# Patient Record
Sex: Female | Born: 1940 | Race: White | Hispanic: No | Marital: Married | Smoking: Current every day smoker
Health system: Southern US, Community
[De-identification: ages and names within clinical notes are randomized; demographics above are authoritative.]

## PROBLEM LIST (undated history)

## (undated) DIAGNOSIS — I10 Essential (primary) hypertension: Secondary | ICD-10-CM

---

## 2002-04-11 HISTORY — PX: HIP SURGERY: SHX245

## 2006-09-15 ENCOUNTER — Ambulatory Visit: Payer: Self-pay | Admitting: Emergency Medicine

## 2007-09-23 ENCOUNTER — Ambulatory Visit: Payer: Self-pay | Admitting: Internal Medicine

## 2007-09-23 IMAGING — CR DG SHOULDER 3+V*L*
1 series · 3 of 3 positions shown · non-contrast
Comparison: none

REASON FOR EXAM: pain - limited ROM
COMMENTS:

PROCEDURE:     MDR - MDR SHOULDER LEFT COMPLETE  - [DATE]  [DATE]
RESULT:     There views of the LEFT shoulder reveal the bones to be
osteopenic. I do not see evidence of an acute fracture. The AC joint and
glenohumeral joint exhibit no more than mild degenerative change.

[Series 1: view not recorded · 0.17mm/px · 3 of 3 slices shown]
[im 1/3]
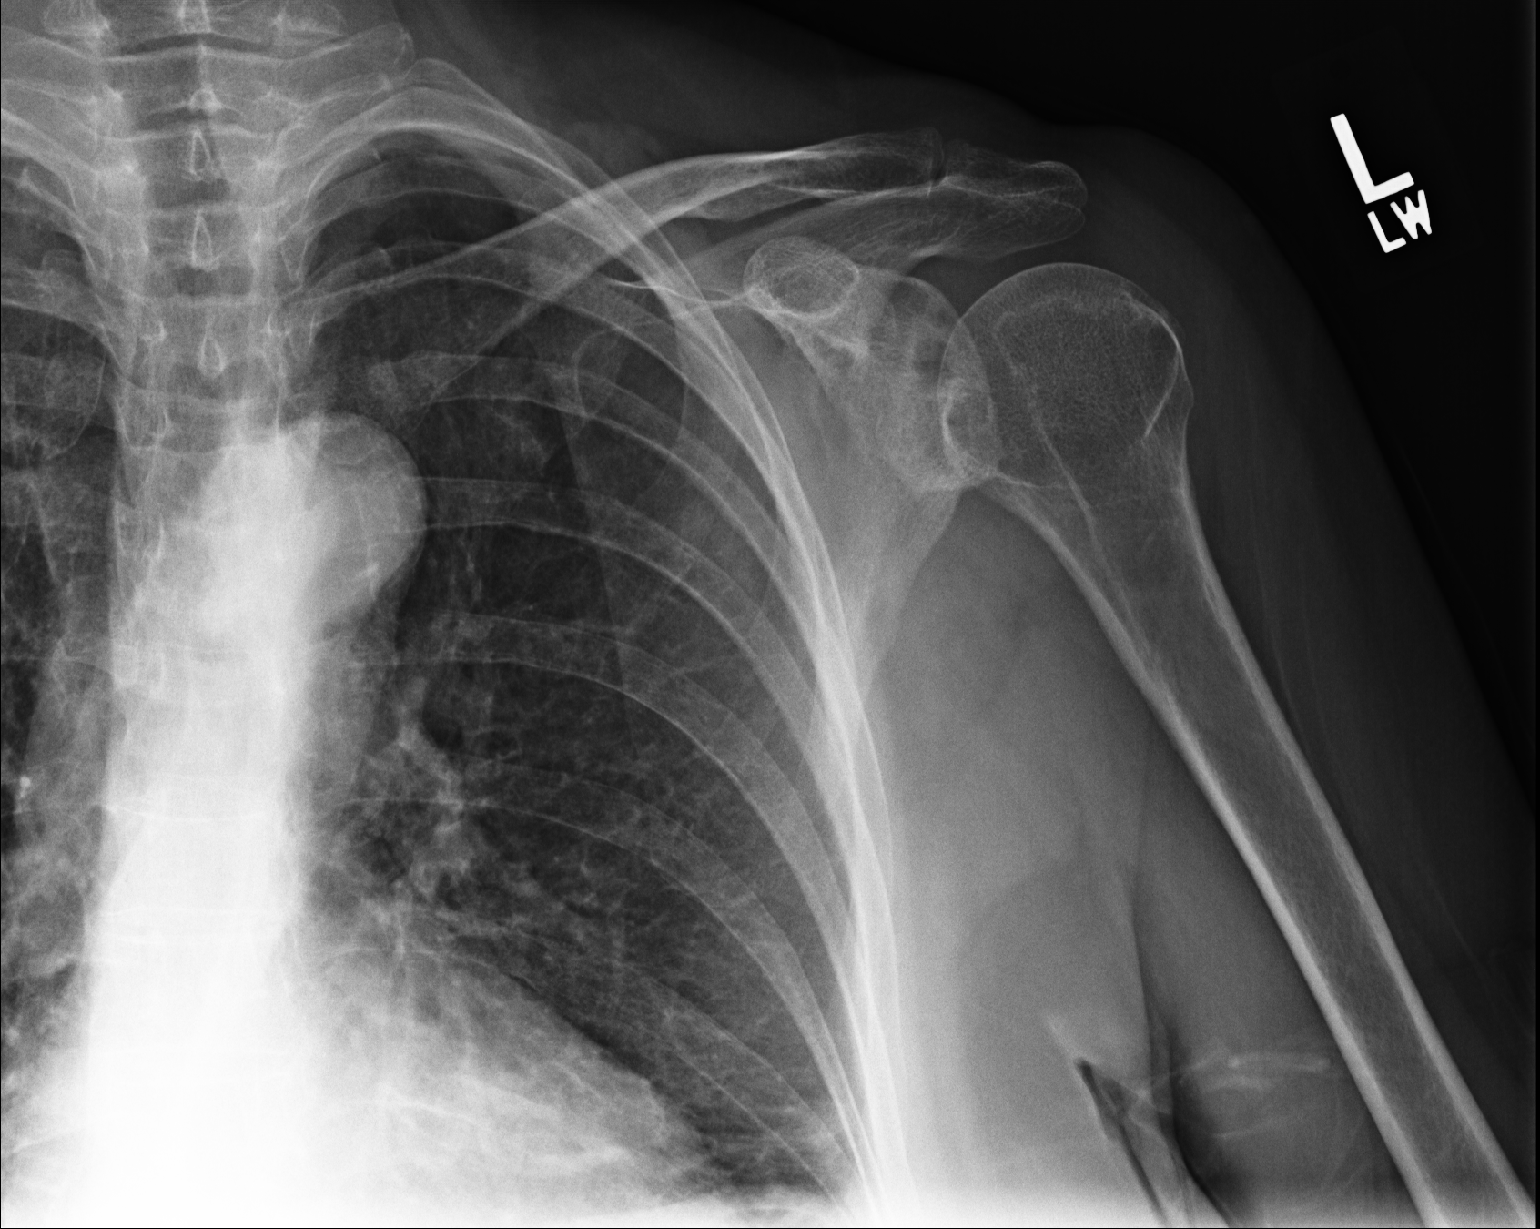
[im 2/3]
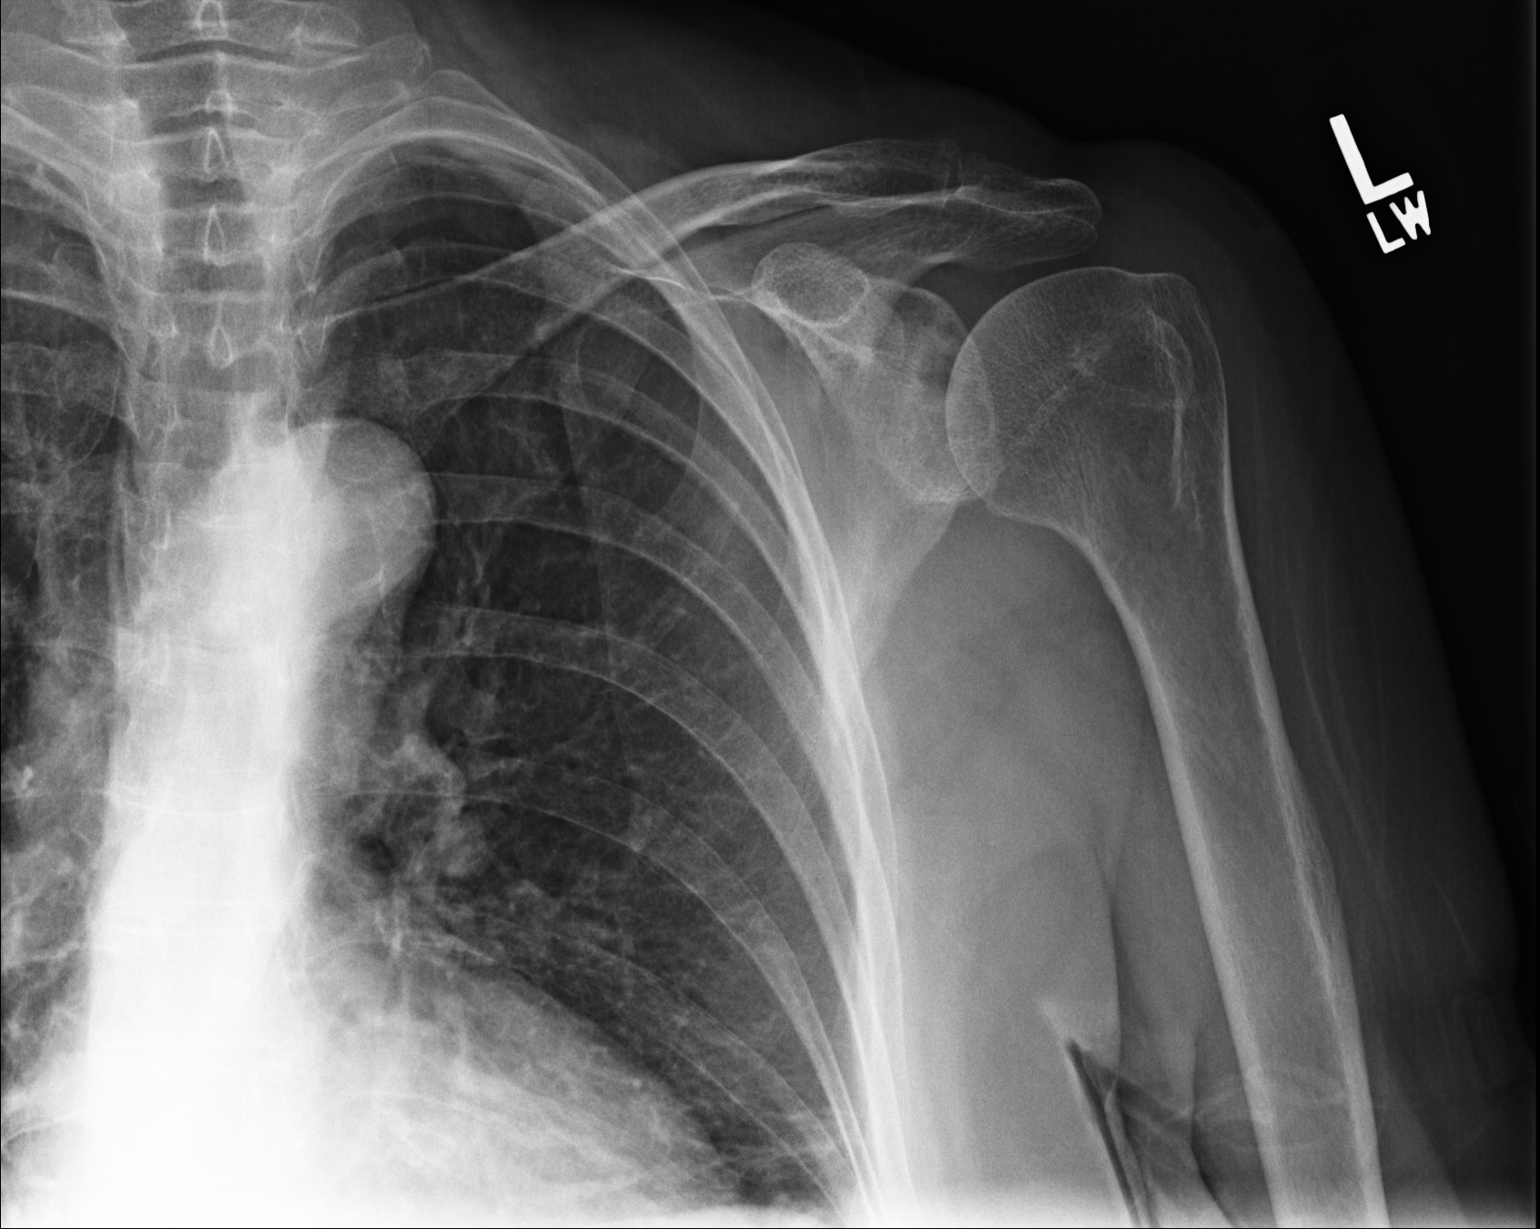
[im 3/3]
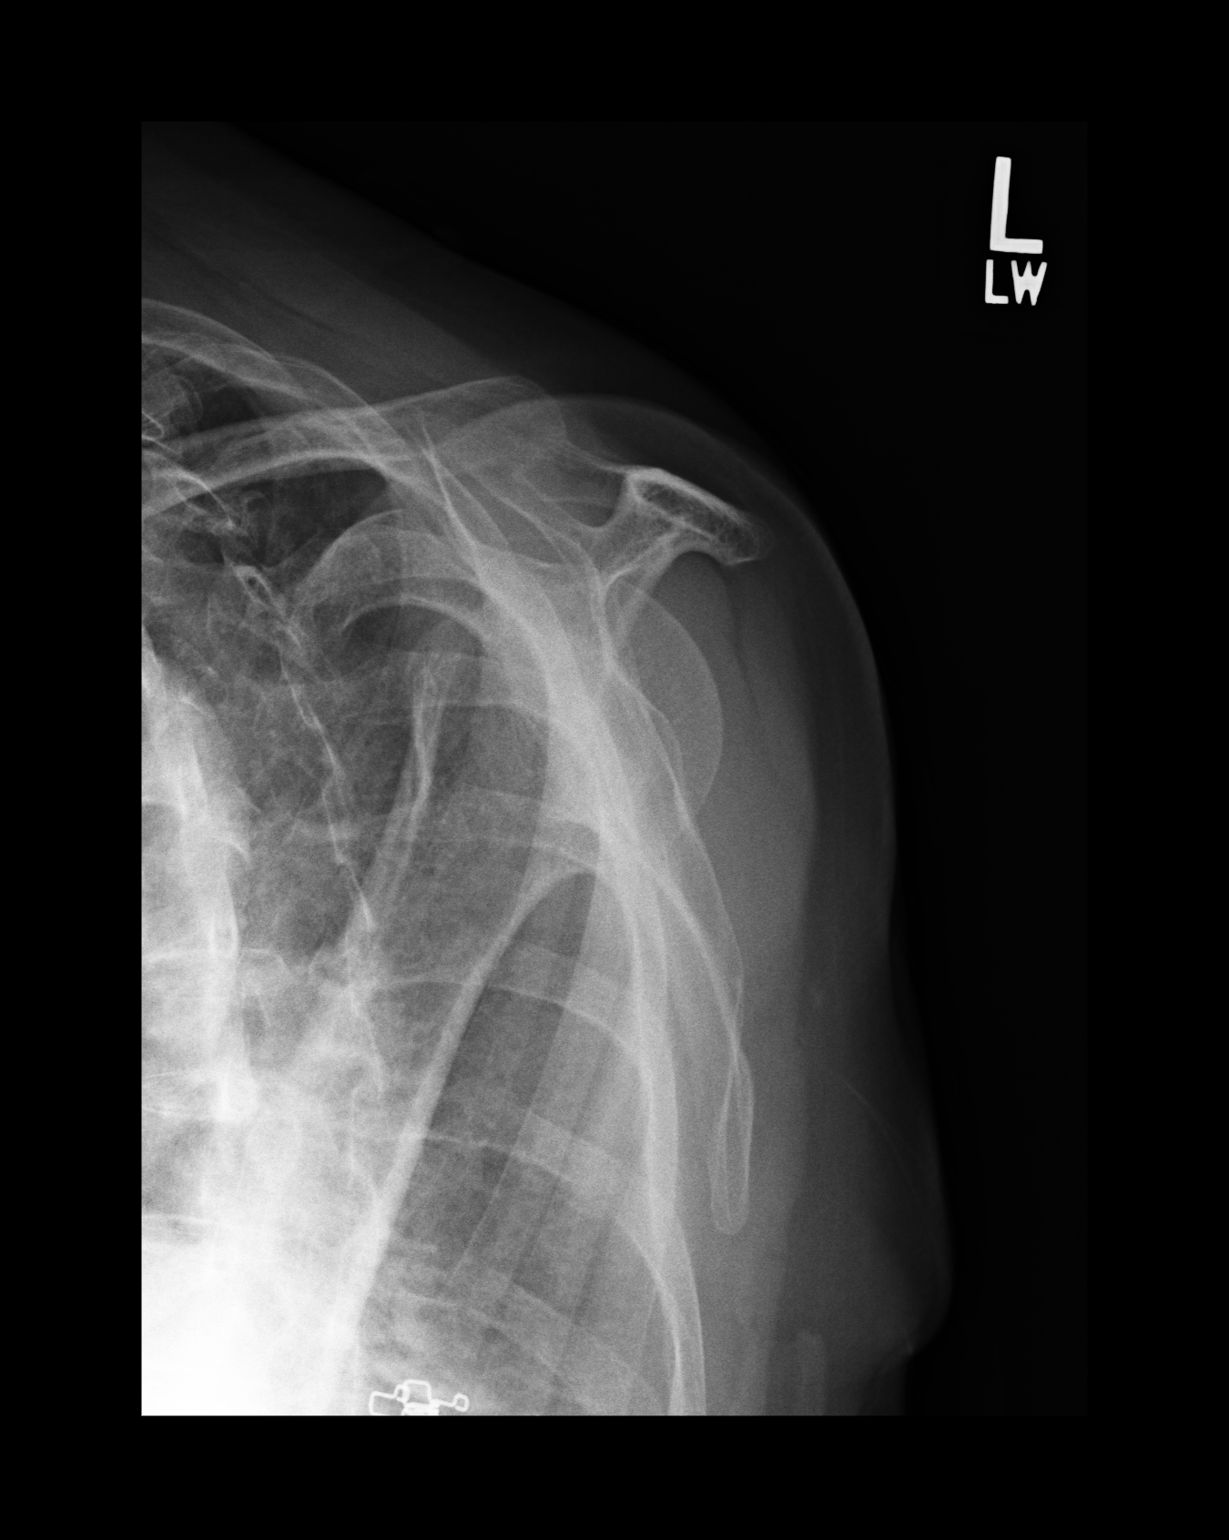

[3 of 3 positions shown; findings below may reference images not displayed]

IMPRESSION: I see no acute bony abnormality of the LEFT shoulder nor
evidence of more than mild degenerative change. Further evaluation with MRI
may be useful especially if rotator cuff pathology is suspected.

## 2008-09-29 ENCOUNTER — Ambulatory Visit: Payer: Self-pay | Admitting: Internal Medicine

## 2017-02-05 ENCOUNTER — Ambulatory Visit
Admission: EM | Admit: 2017-02-05 | Discharge: 2017-02-05 | Disposition: A | Discharge status: Home / Self Care | Payer: Medicare HMO | Attending: Family Medicine | Admitting: Family Medicine

## 2017-02-05 DIAGNOSIS — J0100 Acute maxillary sinusitis, unspecified: Secondary | ICD-10-CM | POA: Diagnosis not present

## 2017-02-05 HISTORY — DX: Essential (primary) hypertension: I10

## 2017-02-05 MED ORDER — DOXYCYCLINE HYCLATE 100 MG PO TABS: 100.0000 mg | ORAL_TABLET | Freq: Two times a day (BID) | ORAL | 0 refills | Status: AC

## 2017-02-05 NOTE — ED Provider Notes (Signed)
[facility]    CSN: [id number] Arrival date & time: 02/05/17  1417     History   Chief Complaint Chief Complaint  Patient presents with  . Influenza    HPI Teresa Acosta is a 76 y.o. female.   The history is provided by the patient.  URI  Presenting symptoms: congestion, cough, facial pain and fatigue   Severity:  Moderate Onset quality:  Sudden Duration:  1 week Timing:  Constant Progression:  Unchanged Chronicity:  New Relieved by:  Prescription medications (states felt better after zpak about 2 weeks ago) Risk factors: being elderly     Past Medical History:  Diagnosis Date  . Hypertension     There are no active problems to display for this patient.   Past Surgical History:  Procedure Laterality Date  . HIP SURGERY Right 2004    OB History    No data available       Home Medications    Prior to Admission medications   Medication Sig Start Date End Date Taking? Authorizing Provider  amLODipine (NORVASC) 2.5 MG tablet Take 2.5 mg by mouth daily.   Yes [provider]  aspirin EC 81 MG tablet Take 81 mg by mouth daily.   Yes [provider]  calcium-vitamin D 250-100 MG-UNIT tablet Take 1 tablet by mouth 2 (two) times daily.   Yes [provider]  co-enzyme Q-10 50 MG capsule Take 50 mg by mouth daily.   Yes [provider]  esomeprazole (NEXIUM) 40 MG capsule Take 40 mg by mouth daily at 12 noon.   Yes [provider]  levothyroxine (SYNTHROID, LEVOTHROID) 75 MCG tablet Take 75 mcg by mouth daily before breakfast.   Yes [provider]  metoprolol tartrate (LOPRESSOR) 50 MG tablet Take 50 mg by mouth 2 (two) times daily.   Yes [provider]  sertraline (ZOLOFT) 50 MG tablet Take 50 mg by mouth daily.   Yes [provider]  simvastatin (ZOCOR) 40 MG tablet Take 40 mg by mouth daily.   Yes [provider]  triamterene-hydrochlorothiazide (DYAZIDE) 37.5-25 MG  capsule Take 1 capsule by mouth daily.   Yes [provider]  vitamin B-12 (CYANOCOBALAMIN) 1000 MCG tablet Take 1,000 mcg by mouth daily.   Yes [provider]  doxycycline (VIBRA-TABS) 100 MG tablet Take 1 tablet (100 mg total) by mouth 2 (two) times daily. 02/05/17   Payton Mccallum, MD    Family History History reviewed. No pertinent family history.  Social History Social History  Substance Use Topics  . Smoking status: Current Every Day Smoker    Packs/day: 0.50  . Smokeless tobacco: Never Used  . Alcohol use No     Allergies   Codeine; Penicillins; and Sulfa antibiotics   Review of Systems Review of Systems  Constitutional: Positive for fatigue.  HENT: Positive for congestion.   Respiratory: Positive for cough.      Physical Exam Triage Vital Signs ED Triage Vitals  Enc Vitals Group     BP 02/05/17 1453 (!) 172/93     Pulse Rate 02/05/17 1453 75     Resp 02/05/17 1453 18     Temp 02/05/17 1453 98.2 F (36.8 C)     Temp Source 02/05/17 1453 Oral     SpO2 02/05/17 1453 100 %     Weight 02/05/17 1453 155 lb (70.3 kg)     Height 02/05/17 1453 5\' 1"  (1.549 m)     Head  Circumference --      Peak Flow --      Pain Score 02/05/17 1451 7     Pain Loc --      Pain Edu? --      Excl. in GC? --    No data found.   Updated Vital Signs BP (!) 172/93 (BP Location: Left Arm)   Pulse 75   Temp 98.2 F (36.8 C) (Oral)   Resp 18   Ht 5\' 1"  (1.549 m)   Wt 155 lb (70.3 kg)   SpO2 100%   BMI 29.29 kg/m   Visual Acuity Right Eye Distance:   Left Eye Distance:   Bilateral Distance:    Right Eye Near:   Left Eye Near:    Bilateral Near:     Physical Exam  Constitutional: She appears well-developed and well-nourished. No distress.  HENT:  Head: Normocephalic and atraumatic.  Right Ear: Tympanic membrane, external ear and ear canal normal.  Left Ear: Tympanic membrane, external ear and ear canal normal.  Nose: Mucosal edema and rhinorrhea  present. No nose lacerations, sinus tenderness, nasal deformity, septal deviation or nasal septal hematoma. No epistaxis.  No foreign bodies. Right sinus exhibits maxillary sinus tenderness and frontal sinus tenderness. Left sinus exhibits maxillary sinus tenderness and frontal sinus tenderness.  Mouth/Throat: Uvula is midline, oropharynx is clear and moist and mucous membranes are normal. No oropharyngeal exudate.  Eyes: Pupils are equal, round, and reactive to light. Conjunctivae and EOM are normal. Right eye exhibits no discharge. Left eye exhibits no discharge. No scleral icterus.  Neck: Normal range of motion. Neck supple. No thyromegaly present.  Cardiovascular: Normal rate, regular rhythm and normal heart sounds.   Pulmonary/Chest: Effort normal and breath sounds normal. No respiratory distress. She has no wheezes. She has no rales.  Lymphadenopathy:    She has no cervical adenopathy.  Skin: She is not diaphoretic.  Nursing note and vitals reviewed.    UC Treatments / Results  Labs (all labs ordered are listed, but only abnormal results are displayed) Labs Reviewed - No data to display  EKG  EKG Interpretation None       Radiology No results found.  Procedures Procedures (including critical care time)  Medications Ordered in UC Medications - No data to display   Initial Impression / Assessment and Plan / UC Course  I have reviewed the triage vital signs and the nursing notes.  Pertinent labs & imaging results that were available during my care of the patient were reviewed by me and considered in my medical decision making (see chart for details).       Final Clinical Impressions(s) / UC Diagnoses   Final diagnoses:  Acute maxillary sinusitis, recurrence not specified    New Prescriptions Discharge Medication List as of 02/05/2017  3:33 PM    START taking these medications   Details  doxycycline (VIBRA-TABS) 100 MG tablet Take 1 tablet (100 mg total) by  mouth 2 (two) times daily., Starting Sun 02/05/2017, Normal       1.diagnosis reviewed with patient 2. rx as per orders above; reviewed possible side effects, interactions, risks and benefits  3. Follow-up prn if symptoms worsen or don't improve  Controlled Substance Prescriptions [place] Controlled Substance Registry consulted? Not Applicable   Payton Mccallumonty, Deshaun Weisinger, MD 02/05/17 [id number]

## 2017-02-05 NOTE — ED Triage Notes (Signed)
Patient states that she was diagnosed with influenza on 01/24/2017. Patient states that she is still coughing and still feeling weak. Patient states that she was also given a zpak at her visit on 10/16.

## 2020-08-12 ENCOUNTER — Other Ambulatory Visit: Payer: Self-pay | Admitting: Physical Medicine & Rehabilitation

## 2020-08-12 ENCOUNTER — Other Ambulatory Visit: Payer: Self-pay

## 2020-08-12 ENCOUNTER — Ambulatory Visit
Admission: RE | Admit: 2020-08-12 | Discharge: 2020-08-12 | Disposition: A | Discharge status: Home / Self Care | Payer: Medicare HMO | Source: Ambulatory Visit | Attending: Physical Medicine & Rehabilitation | Admitting: Physical Medicine & Rehabilitation

## 2020-08-12 DIAGNOSIS — M5442 Lumbago with sciatica, left side: Secondary | ICD-10-CM | POA: Insufficient documentation

## 2020-08-12 DIAGNOSIS — G8929 Other chronic pain: Secondary | ICD-10-CM | POA: Diagnosis present

## 2020-08-12 IMAGING — MR MR LUMBAR SPINE W/O CM
4 of 6 series · 31 of 48 positions shown · non-contrast
Comparison: None.

CLINICAL DATA: Chronic low back pain.

EXAM:
MRI LUMBAR SPINE WITHOUT CONTRAST
TECHNIQUE: Multiplanar, multisequence MR imaging of the lumbar spine was
performed. No intravenous contrast was administered.

[Series 2: T2 · sagittal · 4.0mm · 0.81mm/px · 6 of 17 slices shown (1 of 3)]
[im 1/17]
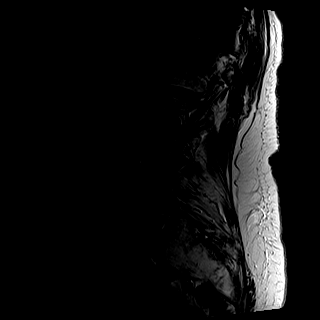
[im 4/17]
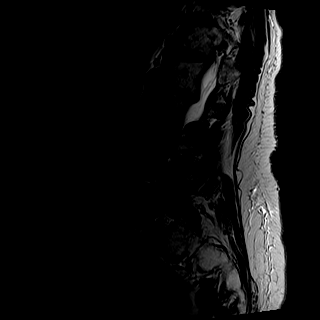
[im 7/17]
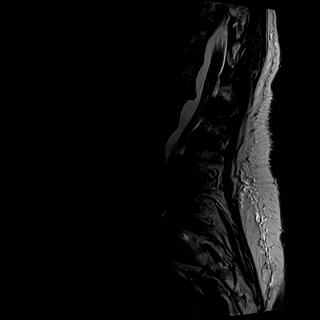
[im 10/17]
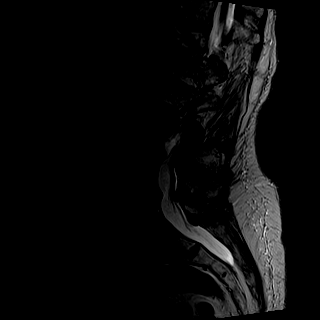
[im 13/17]
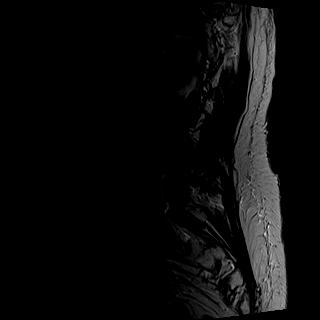
[im 17/17]
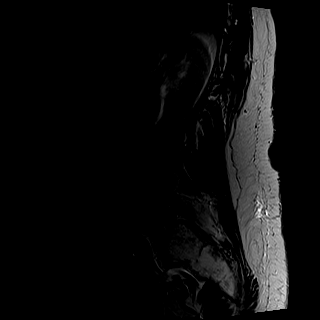

[Series 3: T1 · sagittal · 4.0mm · 0.41mm/px · 6 of 17 slices shown]
[im 1/17]
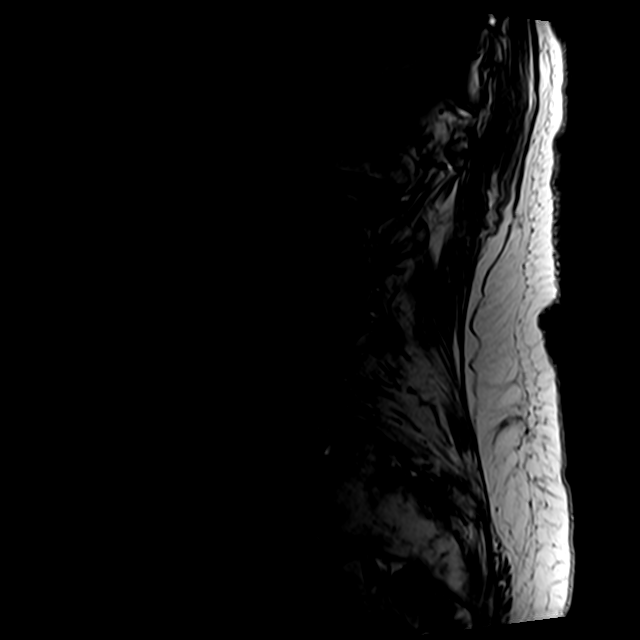
[im 4/17]
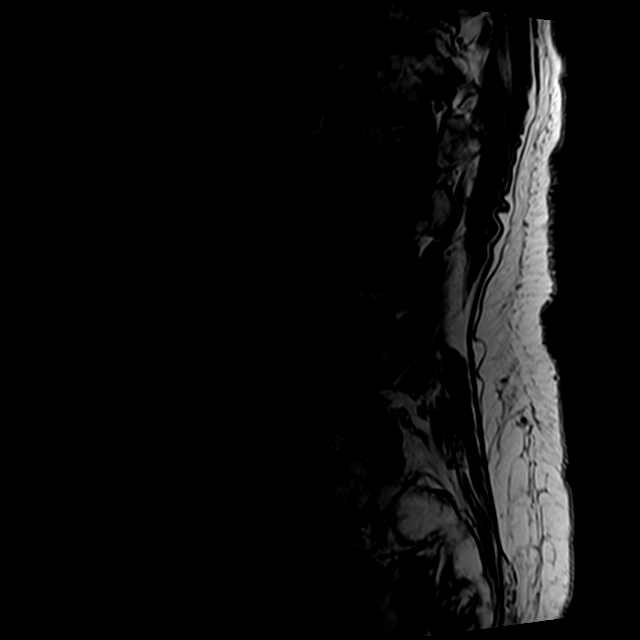
[im 7/17]
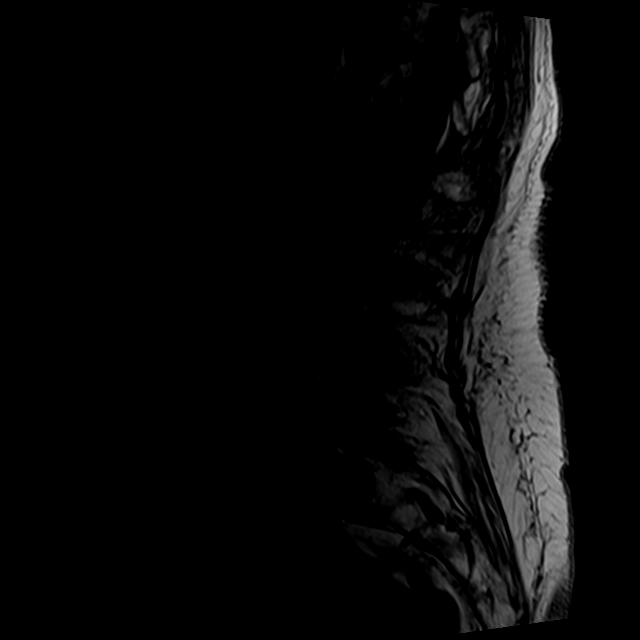
[im 10/17]
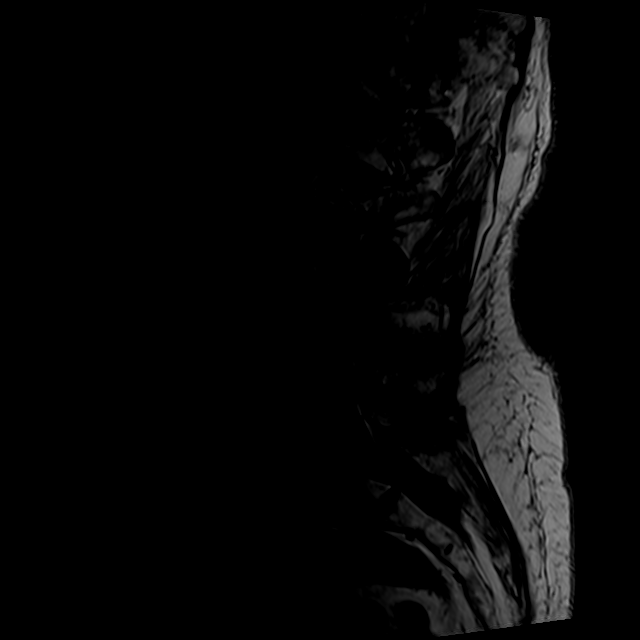
[im 13/17]
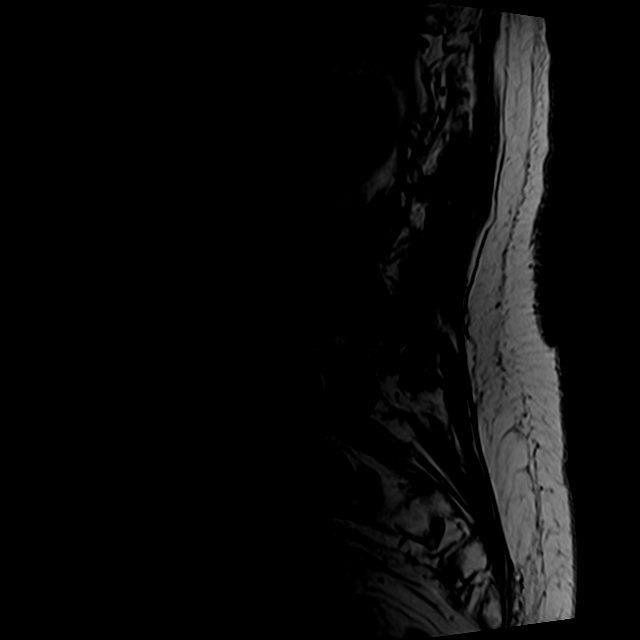
[im 17/17]
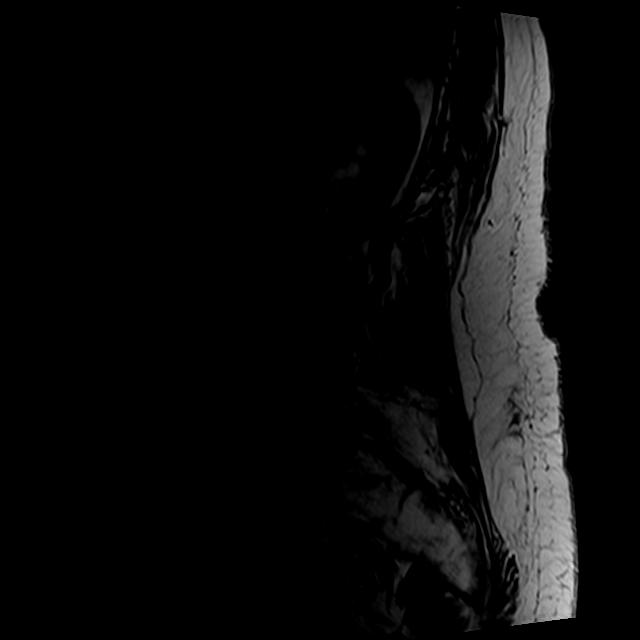

[Series 5: T2 · axial · 4.0mm · 0.78mm/px · z∈[-106,+97]mm · 11 of 34 slices shown (2 of 3)]
[im 1/34]
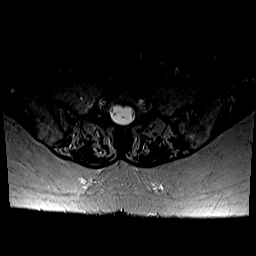
[im 4/34]
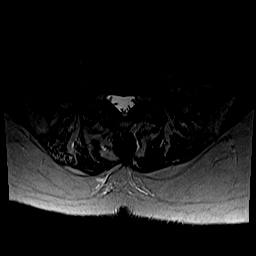
[im 7/34]
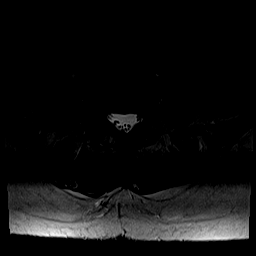
[im 10/34]
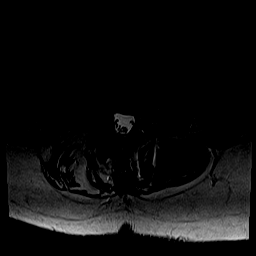
[im 14/34]
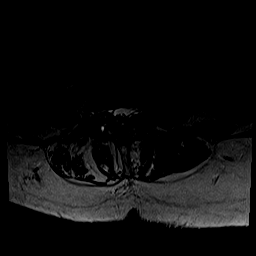
[im 17/34]
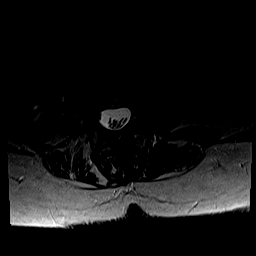
[im 20/34]
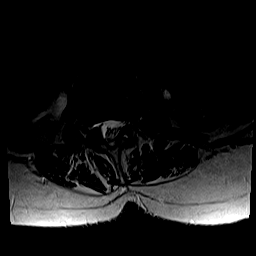
[im 24/34]
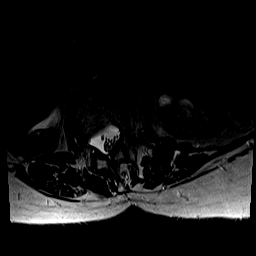
[im 27/34]
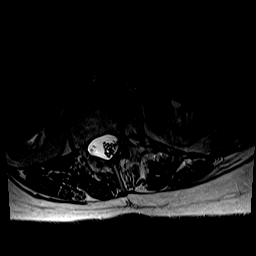
[im 30/34]
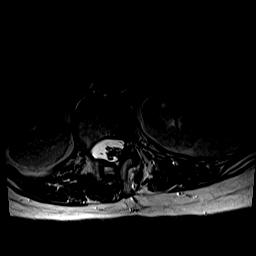
[im 34/34]
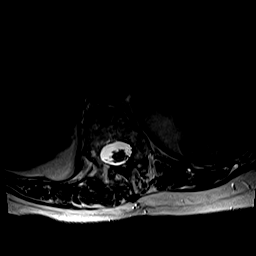

[Series 7: T2 · coronal · 3.0mm · 1.00mm/px · 8 of 24 slices shown (3 of 3)]
[im 1/24]
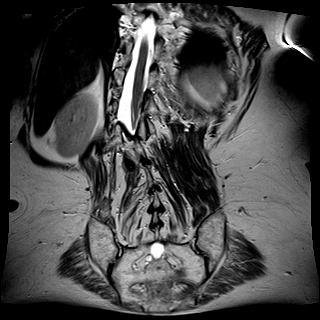
[im 4/24]
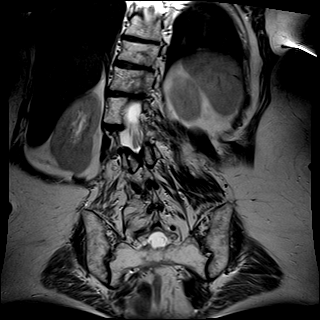
[im 7/24]
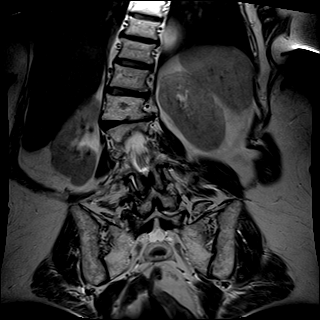
[im 10/24]
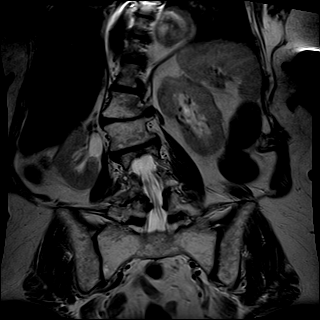
[im 14/24]
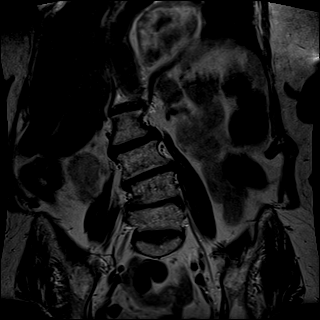
[im 17/24]
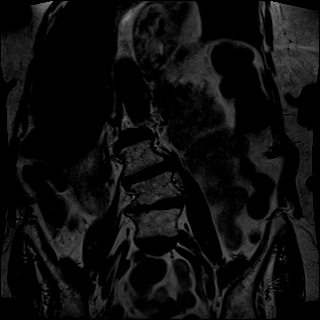
[im 20/24]
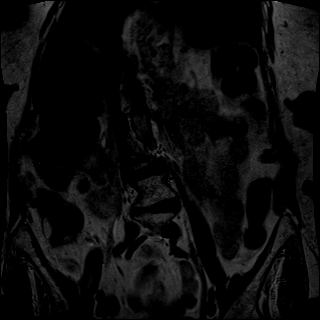
[im 24/24]
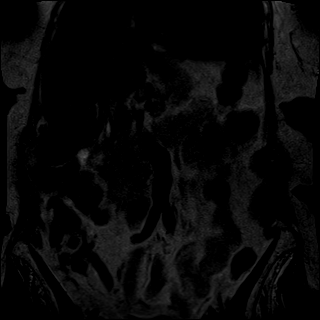

[31 of 48 positions shown; findings below may reference images not displayed]

FINDINGS: Segmentation: Standard segmentation soon. The inferior-most fully
formed intervertebral disc is labeled L5-S1.

Alignment: Marked dextrocurvature of the upper lumbar spine with
compensatory levocurvature of the lower lumbar spine. Slight (grade
1) retrolisthesis of L1 on L2, L2 on L3, and L3 on L4.

Vertebrae: Discogenic/degenerative endplate signal changes at
multiple levels. No specific evidence of acute fracture or
discitis/osteomyelitis.

Conus medullaris and cauda equina: Conus extends to the L1 level.
Conus appears normal

Paraspinal and other soft tissues: Large hiatal hernia, partially
imaged.

Disc levels:

T12-L1: Mild disc bulging and facet hypertrophy with mild left
foraminal stenosis. No significant canal stenosis.

L1-L2: Slight (grade 1) retrolisthesis of L1 on L2. mild broad disc
bulging and mild bilateral facet hypertrophy with mild-to-moderate
left foraminal stenosis. Mild left subarticular recess stenosis
without significant canal stenosis.

L2-L3: Slight (grade 1) retrolisthesis of L2 on L3. Disc bulging and
bilateral facet hypertrophy with mild to moderate left foraminal
stenosis. Mild bilateral subarticular recess stenosis without
significant canal stenosis.

L3-L4: Broad disc bulge with moderate right greater than left facet
hypertrophy and ligamentum flavum thickening. Resulting mild canal
and bilateral subarticular recess stenosis. Moderate right and
mild-to-moderate left foraminal stenosis.

L4-L5: Broad disc bulge with severe right and mild left facet
hypertrophy and ligamentum flavum thickening. Resulting severe right
foraminal stenosis. No significant left foraminal stenosis. Moderate
right subarticular recess stenosis with possible impingement of
descending right L5 nerve roots. Mild canal stenosis. Mild disc
bulging and bilateral facet hypertrophy without significant canal or
foraminal stenosis.

L5-S1: No significant disc protrusion, foraminal stenosis, or canal
stenosis.
IMPRESSION: 1. Severe right foraminal stenosis at L4-L5. Mild to moderate
foraminal stenosis on the left at L1-L2, L2-L3, and L3-L4.
2. Mild canal stenosis at L3-L4 and L4-L5. Moderate right
subarticular recess stenosis at L4-L5 with possible impingement of
descending right L5 nerve roots.
3. Marked dextrocurvature of the upper lumbar spine with
compensatory levocurvature of the lower lumbar spine.
4. Large hiatal hernia, partially imaged.

## 2021-03-06 ENCOUNTER — Other Ambulatory Visit: Payer: Self-pay

## 2021-03-06 ENCOUNTER — Encounter: Payer: Self-pay | Admitting: Emergency Medicine

## 2021-03-06 ENCOUNTER — Ambulatory Visit
Admission: EM | Admit: 2021-03-06 | Discharge: 2021-03-06 | Disposition: A | Discharge status: Home / Self Care | Payer: Medicare HMO | Attending: Physician Assistant | Admitting: Physician Assistant

## 2021-03-06 DIAGNOSIS — M79605 Pain in left leg: Secondary | ICD-10-CM

## 2021-03-06 DIAGNOSIS — G8929 Other chronic pain: Secondary | ICD-10-CM | POA: Diagnosis not present

## 2021-03-06 DIAGNOSIS — I8392 Asymptomatic varicose veins of left lower extremity: Secondary | ICD-10-CM | POA: Diagnosis not present

## 2021-03-06 DIAGNOSIS — M545 Low back pain, unspecified: Secondary | ICD-10-CM

## 2021-03-06 MED ORDER — TRAMADOL HCL 50 MG PO TABS: 50.0000 mg | ORAL_TABLET | Freq: Two times a day (BID) | ORAL | 0 refills | Status: AC | PRN

## 2021-03-06 NOTE — ED Triage Notes (Addendum)
Patient c/o left lower leg pain that started Thursday morning.  Patient states pain is worse when she walks. Patient denies injury or fall.

## 2021-03-06 NOTE — ED Provider Notes (Signed)
[facility]    CSN: [id number] Arrival date & time: 03/06/21  1211      History   Chief Complaint Chief Complaint  Patient presents with   Leg Pain    Left lower    HPI Teresa Acosta is a 80 y.o. female presenting for left lower leg pain for the past 2 to 3 days.  Patient says that it woke her up and its been aching ever since.  She is it feels like it is deep to the bone.  Patient denies any sort of injury.  Increased pain when she is sitting.  Pain seems to improve when she stands.  No associated numbness, weakness or tingling.  Patient denies any fever, increased warmth of the skin or increased swelling.  She does have significant varicose veins mostly of the left lower extremity.  She denies any history of DVT or PE.  Takes daily aspirin.  Patient denies any recent surgeries but says that she did receive an epidural injection for chronic back pain 2 weeks ago.  She has follow-up in 2 days with the provider who performed the injection.  Patient says her back pain has never affected the left extremity but has gone down the right leg at times.  She says her back pain has improved from when she received the injection.  Patient does not report any chest pain or breathing difficulty.  She has tried Tylenol for pain.  She has no other complaints.  HPI  Past Medical History:  Diagnosis Date   Hypertension     There are no problems to display for this patient.   Past Surgical History:  Procedure Laterality Date   HIP SURGERY Right 2004    OB History   No obstetric history on file.      Home Medications    Prior to Admission medications   Medication Sig Start Date End Date Taking? Authorizing Provider  traMADol (ULTRAM) 50 MG tablet Take 1 tablet (50 mg total) by mouth every 12 (twelve) hours as needed for up to 3 days for severe pain. 03/06/21 03/09/21 Yes Eusebio Friendly B, PA-C  amLODipine (NORVASC) 2.5 MG tablet Take 2.5 mg by mouth daily.    [provider]  aspirin EC 81 MG tablet Take 81 mg by mouth daily.    [provider]  calcium-vitamin D 250-100 MG-UNIT tablet Take 1 tablet by mouth 2 (two) times daily.    [provider]  co-enzyme Q-10 50 MG capsule Take 50 mg by mouth daily.    [provider]  doxycycline (VIBRA-TABS) 100 MG tablet Take 1 tablet (100 mg total) by mouth 2 (two) times daily. 02/05/17   Payton Mccallum, MD  esomeprazole (NEXIUM) 40 MG capsule Take 40 mg by mouth daily at 12 noon.    [provider]  levothyroxine (SYNTHROID, LEVOTHROID) 75 MCG tablet Take 75 mcg by mouth daily before breakfast.    [provider]  metoprolol tartrate (LOPRESSOR) 50 MG tablet Take 50 mg by mouth 2 (two) times daily.    [provider]  sertraline (ZOLOFT) 50 MG tablet Take 50 mg by mouth daily.    [provider]  simvastatin (ZOCOR) 40 MG tablet Take 40 mg by mouth daily.    [provider]  triamterene-hydrochlorothiazide (DYAZIDE) 37.5-25 MG capsule Take 1 capsule by mouth daily.    [provider]  vitamin B-12 (CYANOCOBALAMIN) 1000 MCG tablet Take 1,000 mcg by mouth daily.  [provider]    Family History History reviewed. No pertinent family history.  Social History Social History   Tobacco Use   Smoking status: Every Day    Packs/day: 0.50    Types: Cigarettes   Smokeless tobacco: Never  Vaping Use   Vaping Use: Never used  Substance Use Topics   Alcohol use: No   Drug use: No     Allergies   Codeine, Penicillins, Sulfa antibiotics, and Doxycycline   Review of Systems Review of Systems  Constitutional:  Negative for fatigue and fever.  Respiratory:  Negative for shortness of breath.   Cardiovascular:  Negative for chest pain and leg swelling.  Musculoskeletal:  Positive for back pain (chronic). Negative for gait problem and joint swelling.       +Left lower leg pain, mostly of the calf but does go to heel   Skin:  Negative for color change, rash and wound.  Neurological:  Negative for weakness and numbness.    Physical Exam Triage Vital Signs ED Triage Vitals  Enc Vitals Group     BP 03/06/21 1255 136/72     Pulse Rate 03/06/21 1255 72     Resp 03/06/21 1255 14     Temp 03/06/21 1255 98.6 F (37 C)     Temp Source 03/06/21 1255 Oral     SpO2 03/06/21 1255 99 %     Weight 03/06/21 1253 150 lb (68 kg)     Height 03/06/21 1253 5\' 1"  (1.549 m)     Head Circumference --      Peak Flow --      Pain Score 03/06/21 1253 7     Pain Loc --      Pain Edu? --      Excl. in GC? --    No data found.  Updated Vital Signs BP 136/72 (BP Location: Left Arm)   Pulse 72   Temp 98.6 F (37 C) (Oral)   Resp 14   Ht 5\' 1"  (1.549 m)   Wt 150 lb (68 kg)   SpO2 99%   BMI 28.34 kg/m     Physical Exam Vitals and nursing note reviewed.  Constitutional:      General: She is not in acute distress.    Appearance: Normal appearance. She is not ill-appearing or toxic-appearing.  HENT:     Head: Normocephalic and atraumatic.  Eyes:     General: No scleral icterus.       Right eye: No discharge.        Left eye: No discharge.     Conjunctiva/sclera: Conjunctivae normal.  Cardiovascular:     Rate and Rhythm: Normal rate and regular rhythm.     Heart sounds: Normal heart sounds.  Pulmonary:     Effort: Pulmonary effort is normal. No respiratory distress.     Breath sounds: Normal breath sounds.  Musculoskeletal:     Cervical back: Neck supple.     Comments: LEFT LOWER LEG: Significant torturous varicose veins mostly of the anterior left lower leg.  No erythema or warmth.  No rashes, abrasions or lacerations.  36 cm circumference of the left calf which is equal to that of the right calf and measured.  Tenderness to palpation of the entire left lower leg, mostly of the calf.  Full range of motion of the knee and ankle.  Good strength and sensation.  Nontender to palpation of the lower back or  spine.  Negative straight leg raise bilaterally.  Skin:    General: Skin is dry.  Neurological:     General: No focal deficit present.     Mental Status: She is alert. Mental status is at baseline.     Motor: No weakness.     Gait: Gait normal.  Psychiatric:        Mood and Affect: Mood normal.        Behavior: Behavior normal.        Thought Content: Thought content normal.     UC Treatments / Results  Labs (all labs ordered are listed, but only abnormal results are displayed) Labs Reviewed - No data to display  EKG   Radiology No results found.  Procedures Procedures (including critical care time)  Medications Ordered in UC Medications - No data to display  Initial Impression / Assessment and Plan / UC Course  I have reviewed the triage vital signs and the nursing notes.  Pertinent labs & imaging results that were available during my care of the patient were reviewed by me and considered in my medical decision making (see chart for details).   80 year old female presenting for left lower leg pain for the past couple of days.  Denies any injury.  History of chronic back pain but denies radiculopathy of the left side, only of the right side.  She does have significant varicose veins as well but denies any history of DVT/PE or blood clotting problems.  Vitals are stable.  She is overall well-appearing but stands most of the time for comfort.  She holds her lower leg and complains of discomfort and pain.  On exam she has no swelling, erythema, rashes or signs of infection.  She does have significant varicose veins.  Entire left lower leg is tender to palpation.  Negative straight leg raise and no tenderness to palpation of the lower back.  Advised patient that her leg pain could be due to radiculopathy from her back or muscle cramps but we need to rule out DVT since she has significant varicose veins and the calf pain.  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  I did offer to have an order placed to have her go get it done outpatient and I can call her with the results.  She says she cannot do it today but believes she may be able to do tomorrow.  She says she really wants to wait till Monday until she can see her doctor who gave her the spine injection so she can discuss it with them.  Advised patient that I will contact her tomorrow and ask her again about placing order for the DVT rule out ultrasound.  Hopefully patient is agreeable and I can order it through [facility] and call her with the result and treat accordingly if needed.  If study is negative, likely related to her back and she already has a follow-up on Monday.  In the meantime I have given her 4 Ultram.  I did review controlled substance database.  She had no issues with this medication before.  Thoroughly reviewed ED precautions with patient and I made sure she was fully aware of the risks of waiting to have an ultrasound in the event that she did have a DVT.  Discussed that it can lead to PE and life-threatening consequences.  Patient is aware.   Final Clinical Impressions(s) / UC Diagnoses   Final diagnoses:  Left leg pain  Chronic low back pain, unspecified back pain laterality, unspecified whether sciatica present  Varicose veins of left lower extremity, unspecified whether complicated     Discharge Instructions      -As we discussed, there is concern for possible DVT or blood clot in your lower extremity given the area that you are having pain and also your history of significant varicose veins in this area.  I would like you to have an ultrasound performed.  I will you are unable to have it done today which is preferred but I would really like you to have it done tomorrow.  We will have to wait until tomorrow to contact the radiology department to schedule it day of.  I will have to give you a call tomorrow to go  over an appointment time. -Also as we discussed, your pain could be related to your chronic back pain.  That is always a possibility.  I am glad you have an appointment with your doctor on Monday relating to the back pain.  If your ultrasound is negative, pain is likely due to radiation from her back or muscle cramps.  Can take Tylenol for pain or the medication that I have sent.  Only take that medication that I have sent if absolutely needed.  We also discussed the possibility of masking the pain but if the pain worsens you should often go to emergency department. - If you have any chest pain or breathing difficulty you should call 911.  We did discuss the risk of waiting to have the ultrasound done or waiting to be seen for this condition if you did have a blood clot.  The risk could be that you have a piece of the clot break off and go to your lungs and cause a clot in your lungs which could be serious and life-threatening.  [facility] entrance, to radiology dept Address: [address] [place], [place], [place] [address] Phone: [phone]     ED Prescriptions     Medication Sig Dispense Auth. Provider   traMADol (ULTRAM) 50 MG tablet Take 1 tablet (50 mg total) by mouth every 12 (twelve) hours as needed for up to 3 days for severe pain. 4 tablet Gareth Morgan      PDMP not reviewed this encounter.   Shirlee Latch, PA-C 03/06/21 1607

## 2021-03-06 NOTE — Discharge Instructions (Addendum)
-  As we discussed, there is concern for possible DVT or blood clot in your lower extremity given the area that you are having pain and also your history of significant varicose veins in this area.  I would like you to have an ultrasound performed.  I will you are unable to have it done today which is preferred but I would really like you to have it done tomorrow.  We will have to wait until tomorrow to contact the radiology department to schedule it day of.  I will have to give you a call tomorrow to go over an appointment time. -Also as we discussed, your pain could be related to your chronic back pain.  That is always a possibility.  I am glad you have an appointment with your doctor on Monday relating to the back pain.  If your ultrasound is negative, pain is likely due to radiation from her back or muscle cramps.  Can take Tylenol for pain or the medication that I have sent.  Only take that medication that I have sent if absolutely needed.  We also discussed the possibility of masking the pain but if the pain worsens you should often go to emergency department. - If you have any chest pain or breathing difficulty you should call 911.  We did discuss the risk of waiting to have the ultrasound done or waiting to be seen for this condition if you did have a blood clot.  The risk could be that you have a piece of the clot break off and go to your lungs and cause a clot in your lungs which could be serious and life-threatening.  [facility] entrance, to radiology dept Address: [address] [place], [place], [place] [address] Phone: [phone]

## 2021-03-07 ENCOUNTER — Telehealth: Payer: Self-pay | Admitting: Physician Assistant

## 2021-03-07 ENCOUNTER — Ambulatory Visit
Admission: RE | Admit: 2021-03-07 | Discharge: 2021-03-07 | Disposition: A | Discharge status: Home / Self Care | Payer: Medicare HMO | Source: Ambulatory Visit | Attending: Physician Assistant | Admitting: Physician Assistant

## 2021-03-07 DIAGNOSIS — M79605 Pain in left leg: Secondary | ICD-10-CM | POA: Diagnosis not present

## 2021-03-07 DIAGNOSIS — I8392 Asymptomatic varicose veins of left lower extremity: Secondary | ICD-10-CM | POA: Diagnosis not present

## 2021-03-07 IMAGING — US US EXTREM LOW VENOUS*L*
1 series · 13 of 24 positions shown · non-contrast
Comparison: None.

CLINICAL DATA: Left lower extremity pain for the past 3 days.
Evaluate for DVT.



[Series 1: us venous img lower uni left (dvt) · portal-venous · 13 of 40 slices shown]
[im 1/40]
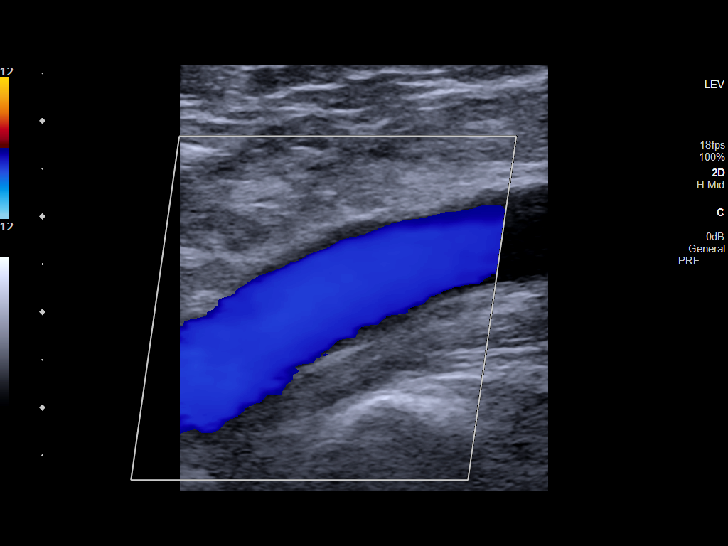
[im 4/40]
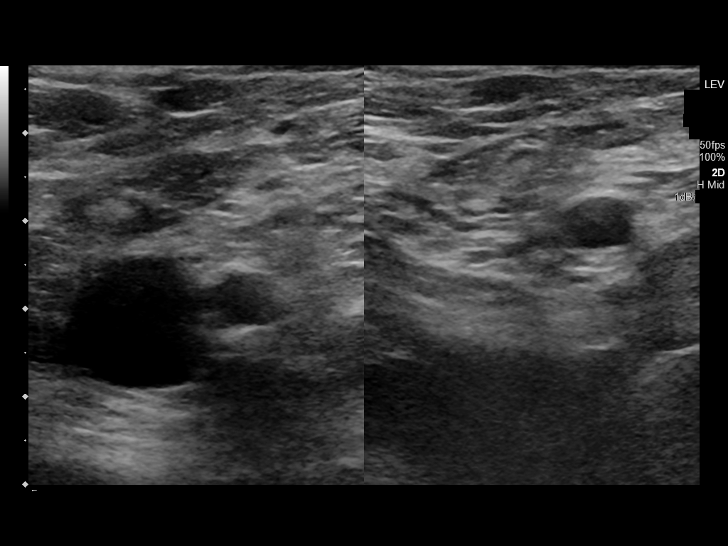
[im 7/40]
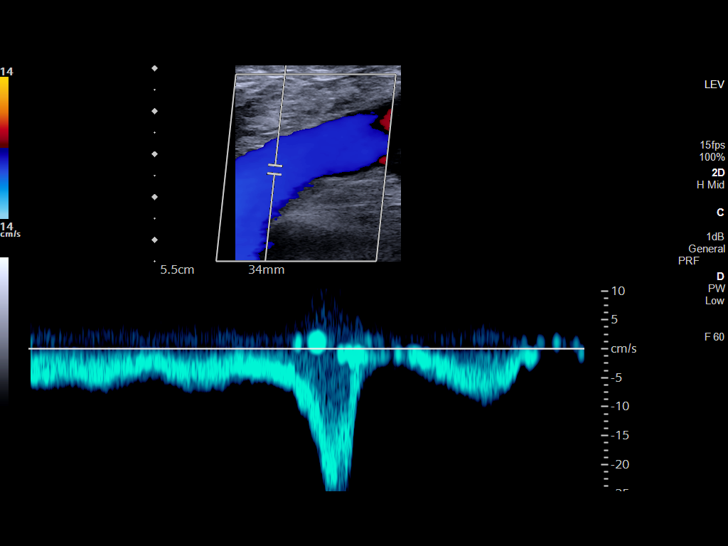
[im 11/40]
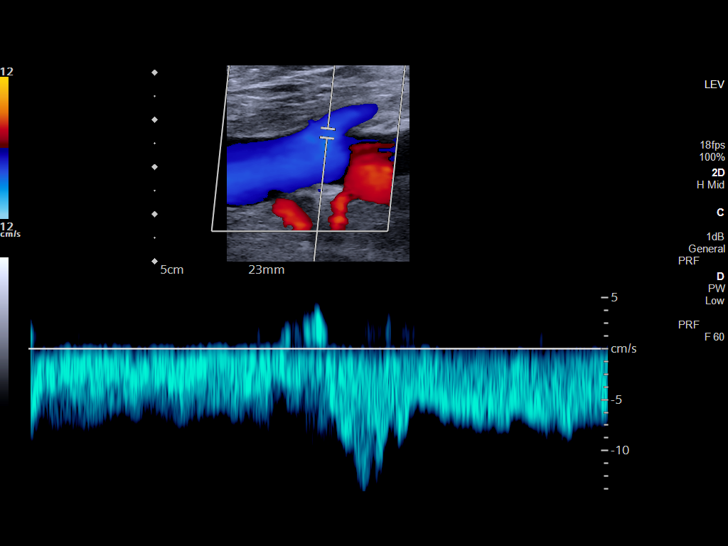
[im 14/40]
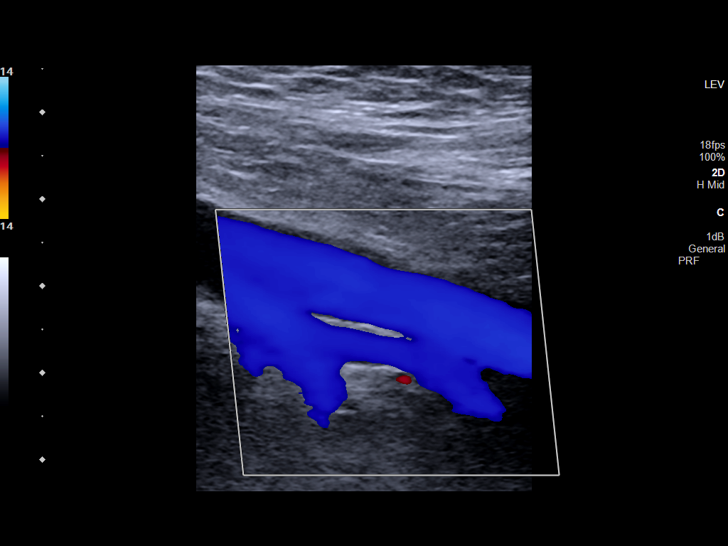
[im 17/40]
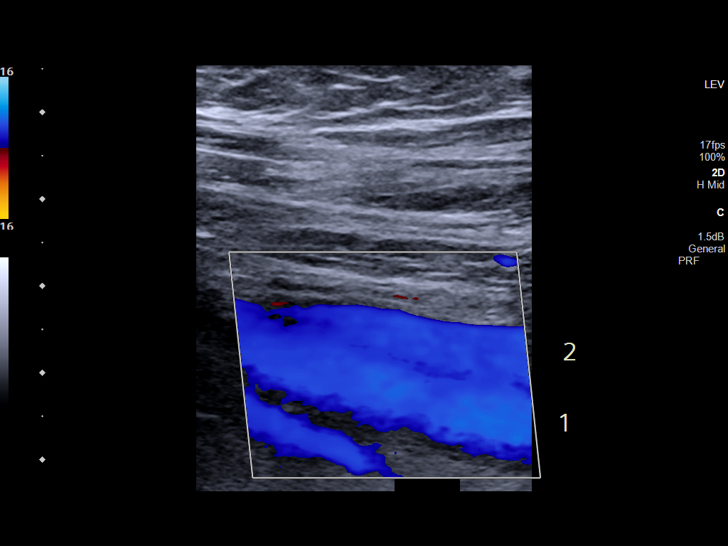
[im 21/40]
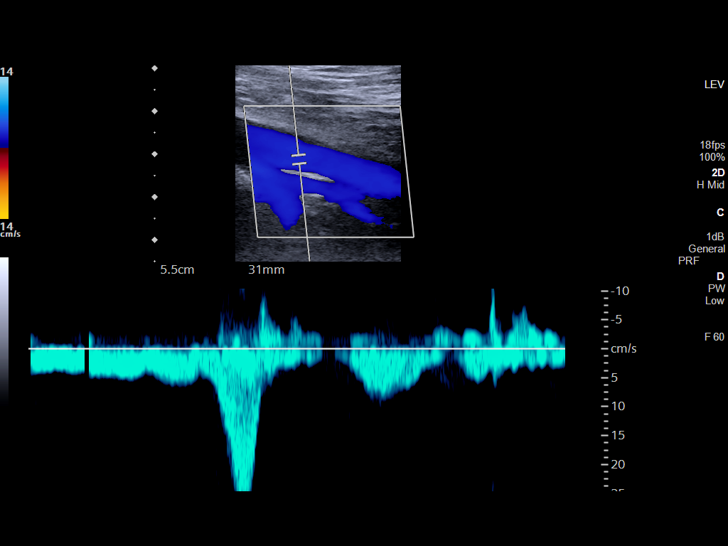
[im 23/40]
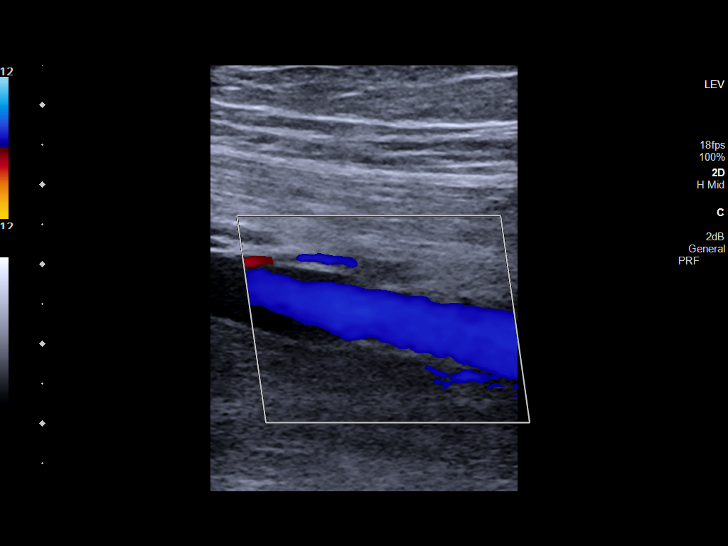
[im 26/40]
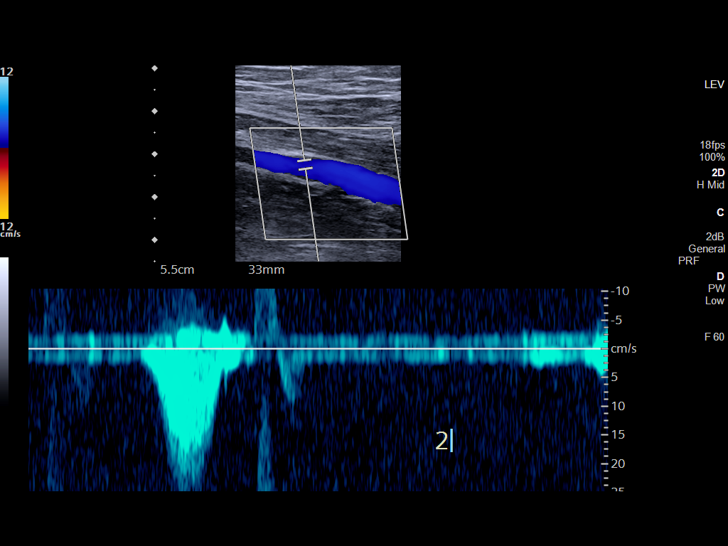
[im 29/40]
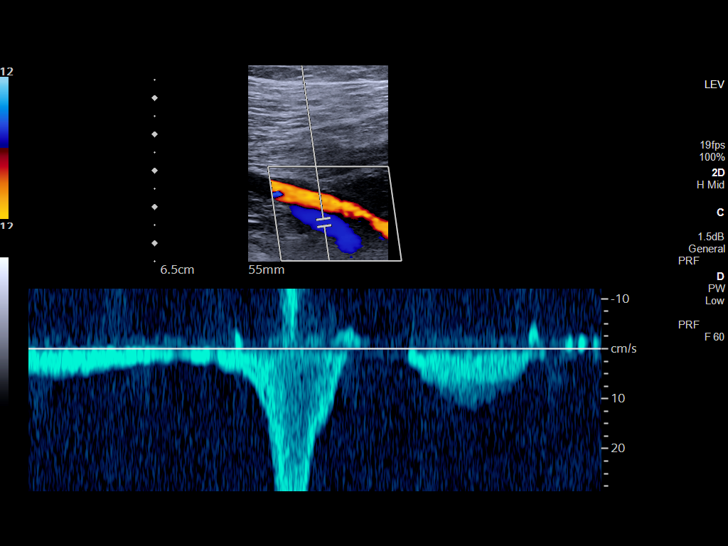
[im 33/40]
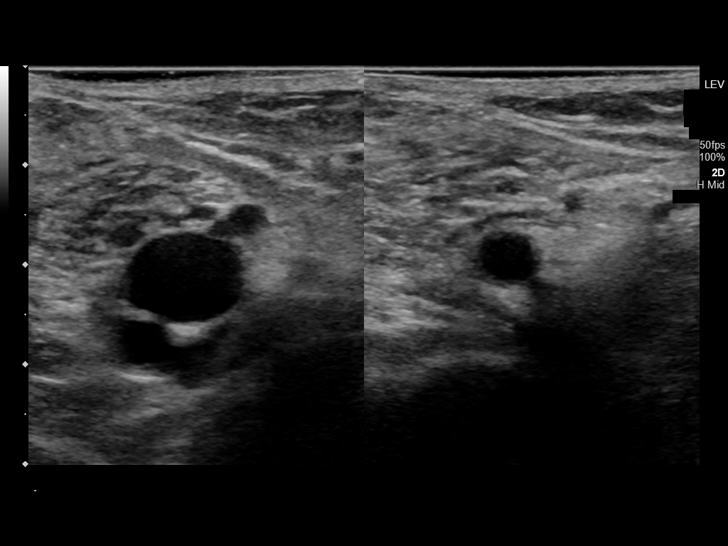
[im 36/40]
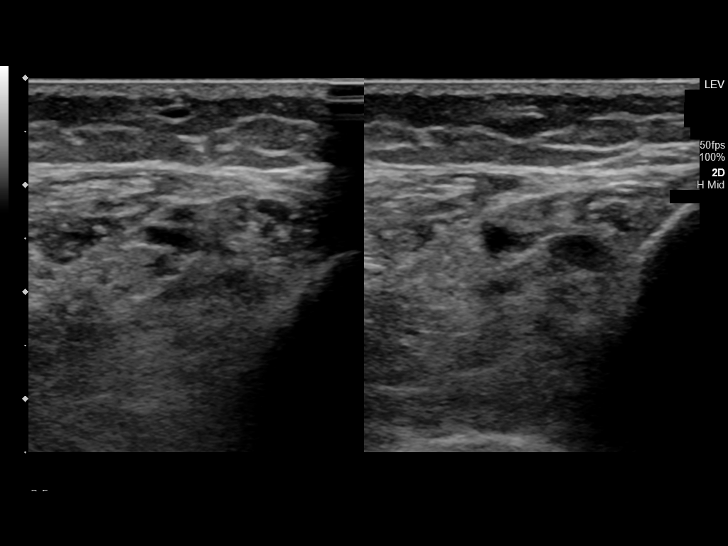
[im 40/40]
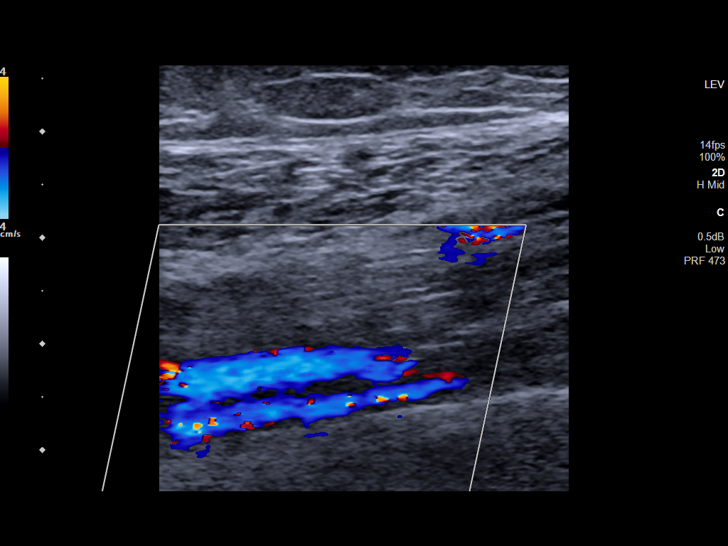

[13 of 24 positions shown; findings below may reference images not displayed]

FINDINGS: Contralateral Common Femoral Vein: Respiratory phasicity is normal
and symmetric with the symptomatic side. No evidence of thrombus.
Normal compressibility.

Common Femoral Vein: No evidence of thrombus. Normal
compressibility, respiratory phasicity and response to augmentation.

Saphenofemoral Junction: No evidence of thrombus. Normal
compressibility and flow on color Doppler imaging.

Profunda Femoral Vein: No evidence of thrombus. Normal
compressibility and flow on color Doppler imaging.

Femoral Vein: No evidence of thrombus within either of the paired
segments of the femoral vein. Normal compressibility, respiratory
phasicity and response to augmentation.

Popliteal Vein: No evidence of thrombus. Normal compressibility,
respiratory phasicity and response to augmentation.

Calf Veins: No evidence of thrombus. Normal compressibility and flow
on color Doppler imaging.

Superficial Great Saphenous Vein: No evidence of thrombus. Normal
compressibility.

Venous Reflux:  None.

Other Findings:  None.
IMPRESSION: No evidence of DVT within the left lower extremity.

## 2021-03-07 NOTE — Telephone Encounter (Signed)
I called the discussed results of ultrasound with patient.  Negative for DVT.  Gave her the good news.  Advised her that her lower leg pain is most likely due to her back or may be muscle cramps/spasms.  She has been using heat and muscle rubs.  Advised her to try the Ultram especially now since we know she does not have a blood clot.  She says she has been mostly trying the Tylenol.  Advised her to keep her appointment with her doctor in the morning.  We will

## 2021-03-18 ENCOUNTER — Other Ambulatory Visit: Payer: Self-pay | Admitting: Physical Medicine & Rehabilitation

## 2021-03-18 DIAGNOSIS — M5442 Lumbago with sciatica, left side: Secondary | ICD-10-CM

## 2021-03-18 DIAGNOSIS — M5441 Lumbago with sciatica, right side: Secondary | ICD-10-CM

## 2021-03-18 DIAGNOSIS — M48062 Spinal stenosis, lumbar region with neurogenic claudication: Secondary | ICD-10-CM

## 2021-03-30 ENCOUNTER — Other Ambulatory Visit: Payer: Self-pay

## 2021-03-30 ENCOUNTER — Ambulatory Visit
Admission: RE | Admit: 2021-03-30 | Discharge: 2021-03-30 | Disposition: A | Discharge status: Home / Self Care | Payer: Medicare HMO | Source: Ambulatory Visit | Attending: Physical Medicine & Rehabilitation | Admitting: Physical Medicine & Rehabilitation

## 2021-03-30 DIAGNOSIS — M5441 Lumbago with sciatica, right side: Secondary | ICD-10-CM | POA: Diagnosis present

## 2021-03-30 DIAGNOSIS — M5442 Lumbago with sciatica, left side: Secondary | ICD-10-CM | POA: Diagnosis present

## 2021-03-30 DIAGNOSIS — M48062 Spinal stenosis, lumbar region with neurogenic claudication: Secondary | ICD-10-CM

## 2021-03-30 IMAGING — MR MR LUMBAR SPINE W/O CM
4 of 5 series · 27 of 48 positions shown · non-contrast
Comparison: MRI examination dated [DATE]

CLINICAL DATA: No known injury. Left-sided low back and left
hip/leg and foot pain

EXAM:
MRI LUMBAR SPINE WITHOUT CONTRAST
TECHNIQUE: Multiplanar, multisequence MR imaging of the lumbar spine was
performed. No intravenous contrast was administered.

[Series 5: T2 · sagittal · 4.0mm · 0.75mm/px · 6 of 18 slices shown (1 of 2)]
[im 1/18]
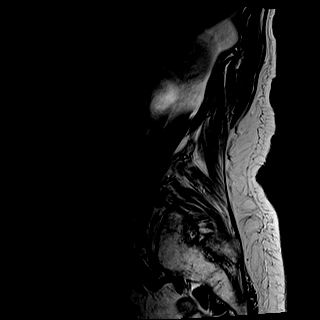
[im 4/18]
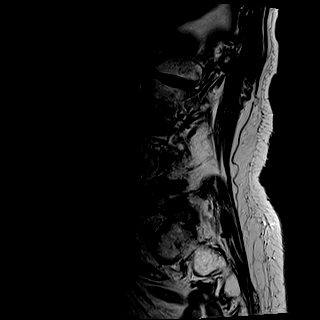
[im 7/18]
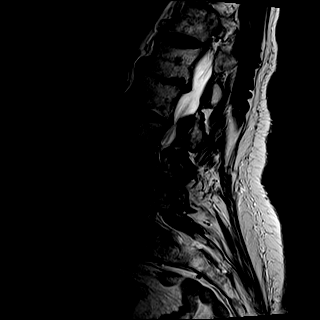
[im 11/18]
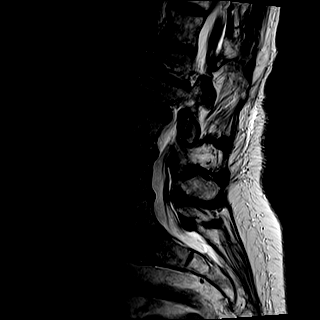
[im 14/18]
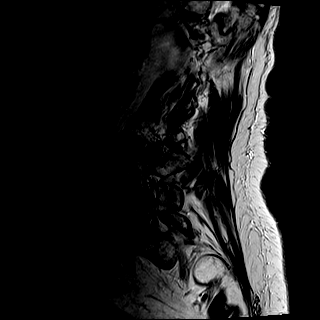
[im 18/18]
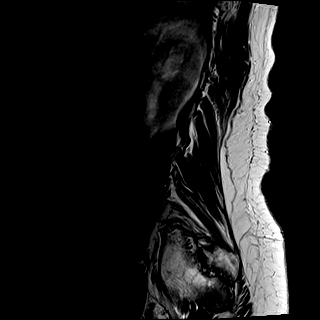

[Series 6: T1 · sagittal · 4.0mm · 0.75mm/px · 7 of 18 slices shown (1 of 2)]
[im 1/18]
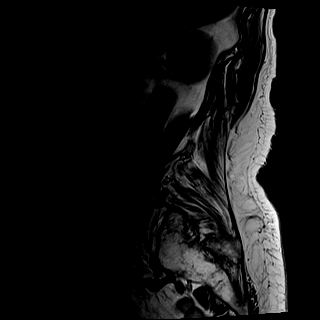
[im 3/18]
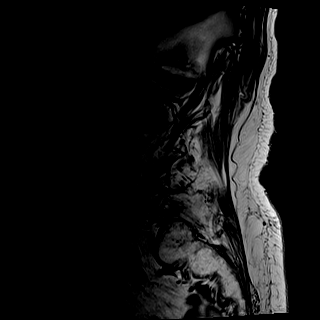
[im 6/18]
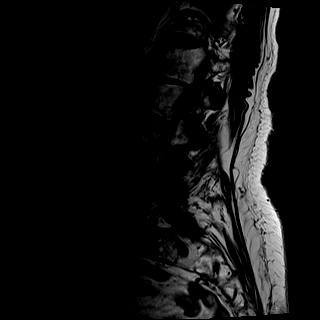
[im 9/18]
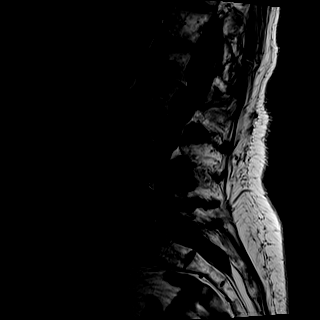
[im 12/18]
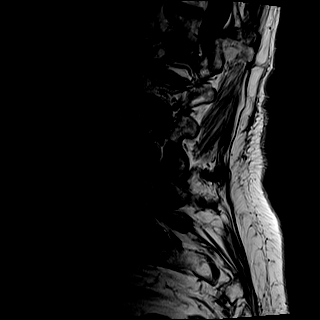
[im 15/18]
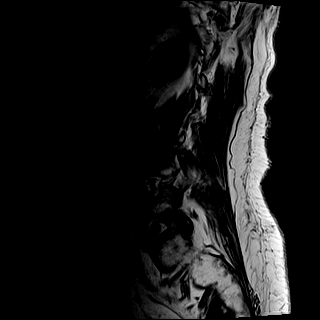
[im 18/18]
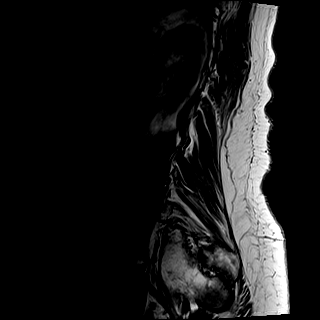

[Series 10: T2 · axial · 4.0mm · 0.52mm/px · z∈[-35,+185]mm · 8 of 36 slices shown (2 of 2)]
[im 1/36]
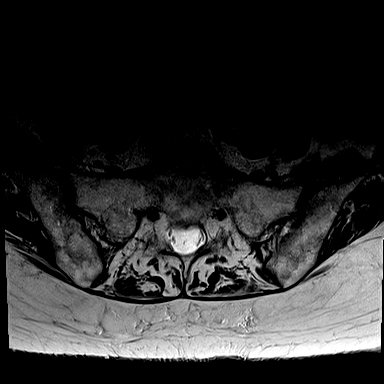
[im 6/36]
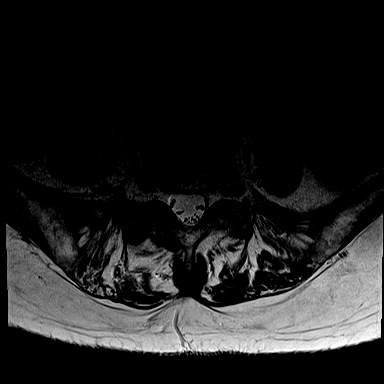
[im 11/36]
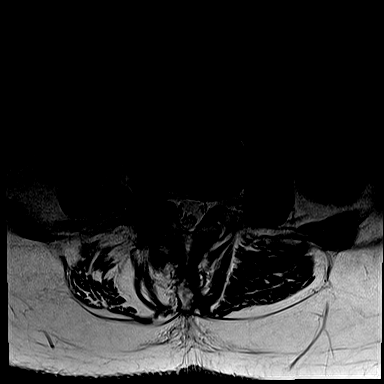
[im 17/36]
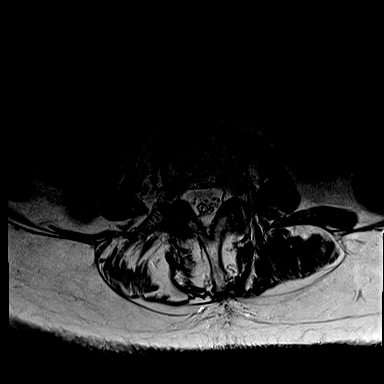
[im 19/36]
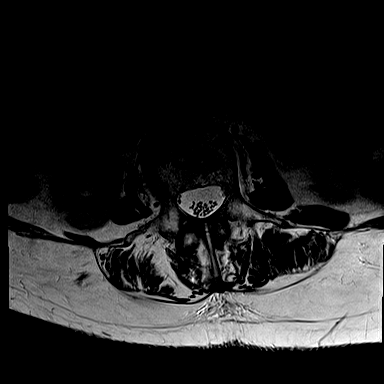
[im 25/36]
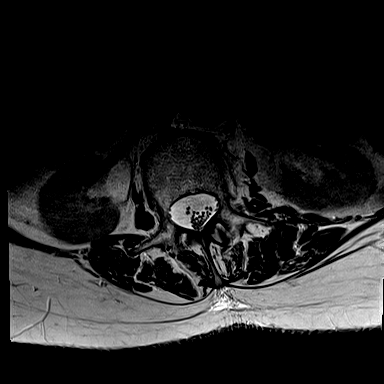
[im 30/36]
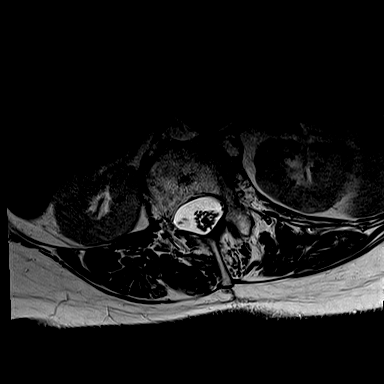
[im 36/36]
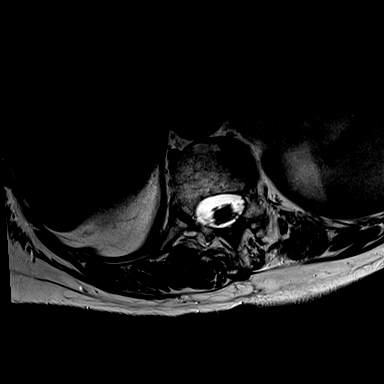

[Series 13: T1 · axial · 4.0mm · 0.31mm/px · z∈[-35,+158]mm · 6 of 36 slices shown (2 of 2)]
[im 1/36]
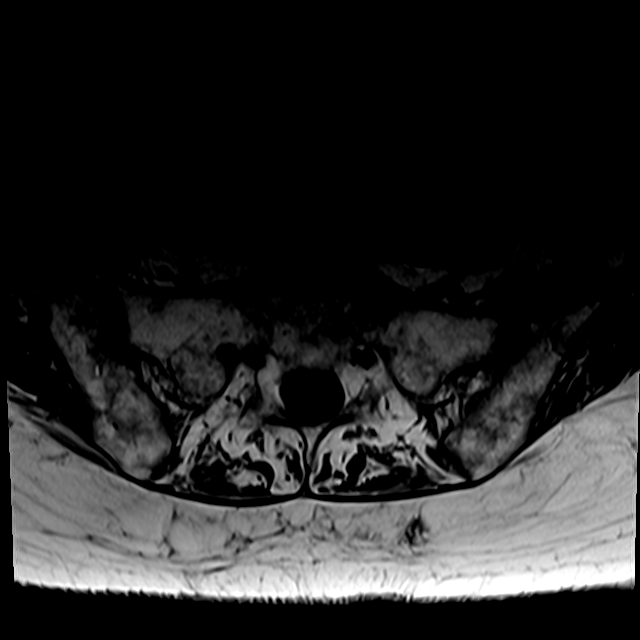
[im 6/36]
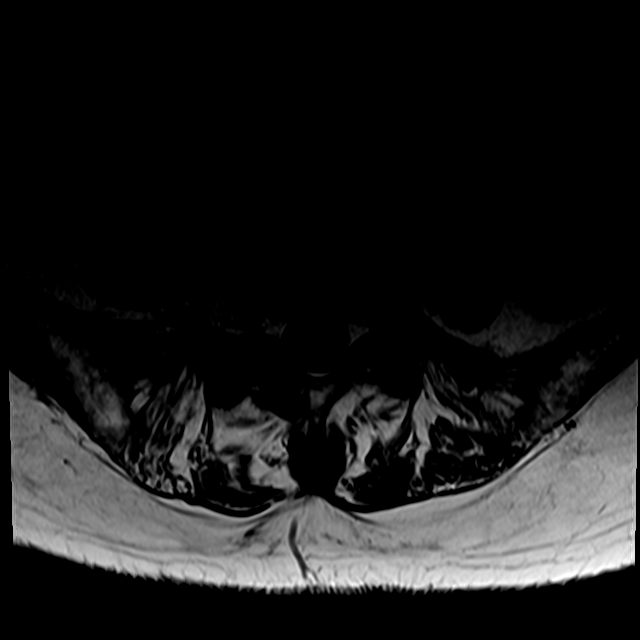
[im 11/36]
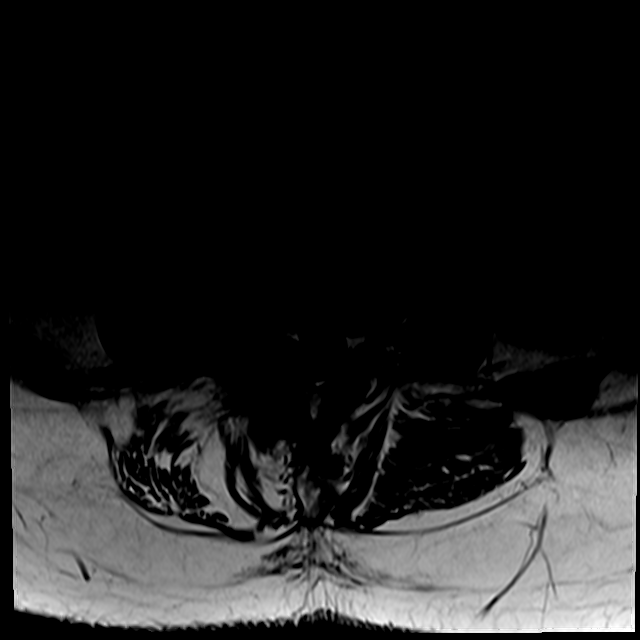
[im 17/36]
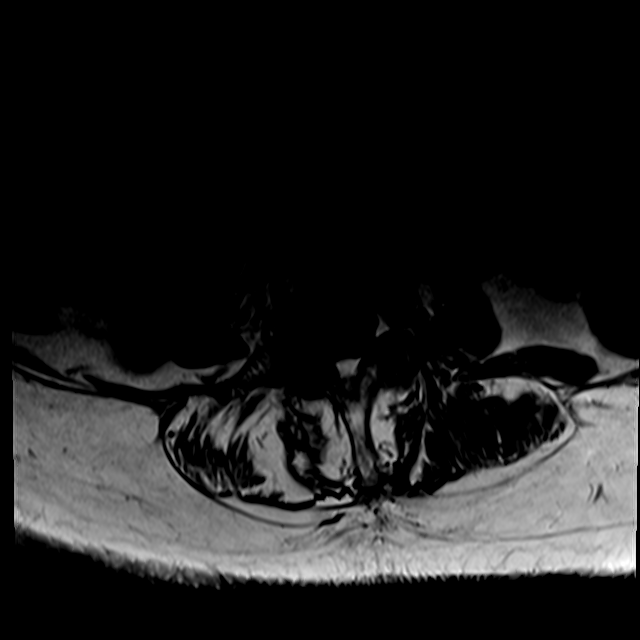
[im 19/36]
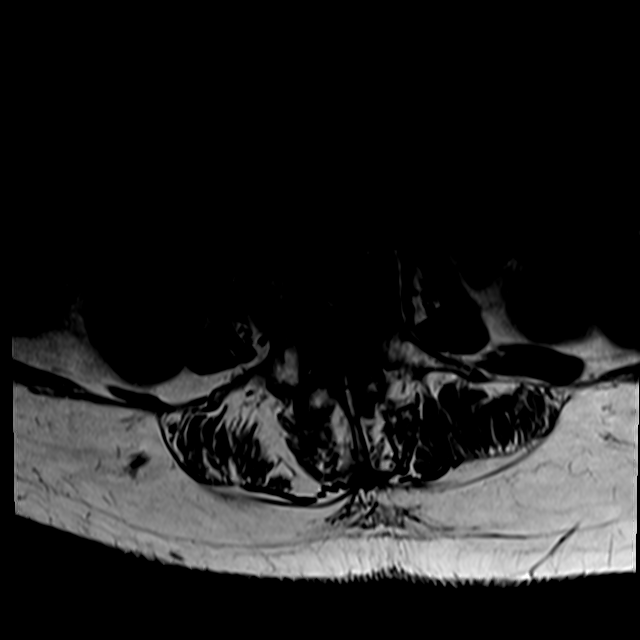
[im 30/36]
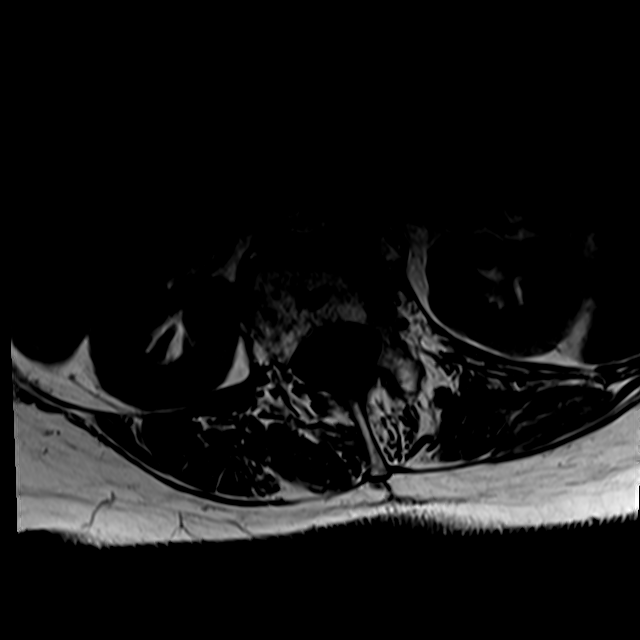

[27 of 48 positions shown; findings below may reference images not displayed]

FINDINGS: Segmentation:  Standard.

Alignment:  Marked dextroscoliosis centered at L1-L2 disc space.

Vertebrae:  No fracture, evidence of discitis, or bone lesion.

Conus medullaris and cauda equina: Conus extends to the L1 level.
Conus and cauda equina appear normal.

Paraspinal and other soft tissues: Moderate fatty infiltration and
atrophy of the paraspinal muscles. No acute abnormality.

Disc spaces:

T12-L1: Mild asymmetric disc protrusion to the right without
significant spinal canal narrowing. Mild ligamentum flavum
hypertrophy on the right. No significant neural foraminal narrowing

L1-L2: Broad-base disc protrusion with bilateral ligamentum flavum
hypertrophy. No significant spinal canal stenosis. Mild right neural
foraminal narrowing.

L2-L3: Broad-based circumferential disc protrusion with narrowing of
bilateral recess. Ligamentum flavum hypertrophy prominent on the
left. No significant neural foraminal narrowing.

L3-L4: Circumferential disc protrusion and prominent ligamentum
flavum hypertrophy with moderate narrowing of spinal canal. Mild
right and moderate to severe left neural foraminal narrowing. Facet
joint arthropathy.

L4-L5: Broad-based circumferential disc protrusion and ligamentum
flavum hypertrophy with narrowing of spinal canal. Mild bilateral
neural foraminal narrowing.

L5-S1: Circumferential disc protrusion. Prominent ligamentum flavum
hypertrophy. No significant spinal canal or neural foraminal
narrowing.
IMPRESSION: 1. Moderate dextroscoliosis centered at L1-L2 disc space without
evidence of acute fracture or subluxation.

2. Advanced multilevel degenerative disc disease with narrowing of
spinal canal and neural foramina as detailed above.
